# Patient Record
Sex: Female | Born: 2000
Health system: Southern US, Community
[De-identification: ages and names within clinical notes are randomized; demographics above are authoritative.]

---
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
## 2019-02-25 ENCOUNTER — Other Ambulatory Visit: Payer: Self-pay

## 2019-02-26 ENCOUNTER — Ambulatory Visit (INDEPENDENT_AMBULATORY_CARE_PROVIDER_SITE_OTHER): Payer: BC Managed Care – PPO | Admitting: Physician Assistant

## 2019-02-26 ENCOUNTER — Encounter: Payer: Self-pay | Admitting: Physician Assistant

## 2019-02-26 VITALS — BP 100/70 | HR 78 | Temp 97.6°F | Ht 69.0 in | Wt 133.5 lb

## 2019-02-26 DIAGNOSIS — L700 Acne vulgaris: Secondary | ICD-10-CM

## 2019-02-26 DIAGNOSIS — R21 Rash and other nonspecific skin eruption: Secondary | ICD-10-CM | POA: Diagnosis not present

## 2019-02-26 MED ORDER — DOXYCYCLINE HYCLATE 100 MG PO TABS: 100.0000 mg | ORAL_TABLET | Freq: Two times a day (BID) | ORAL | 0 refills | Status: AC

## 2019-02-26 NOTE — Progress Notes (Signed)
Rachael Payne is a 18 y.o. female here for a new problem.  I acted as a Neurosurgeon for [employer], PA-C Corky Mull, LPN  History of Present Illness:   Chief Complaint  Patient presents with  . Rash    HPI   Rash Pt c/o red rash on face, nose, mouth and sides of face. Started 4 days ago. She has tried OTC triple-antibiotic creams with no relief, took Benadryl and rash faded but came back. Burns when she puts moisturizer on.  Denies: fever, chills, significant crusting/oozing, new face products, changes to mask, new foods, concerns for food allergies  History reviewed. No pertinent past medical history.   Social History   Socioeconomic History  . Marital status: Single    Spouse name: Not on file  . Number of children: Not on file  . Years of education: Not on file  . Highest education level: Not on file  Occupational History  . Not on file  Tobacco Use  . Smoking status: Never Smoker  . Smokeless tobacco: Never Used  Substance and Sexual Activity  . Alcohol use: Not on file    Comment: occ   . Drug use: Never  . Sexual activity: Never  Other Topics Concern  . Not on file  Social History Narrative   [place] -- Exploratory Studies   Social Determinants of Health   Financial Resource Strain:   . Difficulty of Paying Living Expenses: Not on file  Food Insecurity:   . Worried About Programme researcher, broadcasting/film/video in the Last Year: Not on file  . Ran Out of Food in the Last Year: Not on file  Transportation Needs:   . Lack of Transportation (Medical): Not on file  . Lack of Transportation (Non-Medical): Not on file  Physical Activity:   . Days of Exercise per Week: Not on file  . Minutes of Exercise per Session: Not on file  Stress:   . Feeling of Stress : Not on file  Social Connections:   . Frequency of Communication with Friends and Family: Not on file  . Frequency of Social Gatherings with Friends and Family: Not on file  . Attends Religious Services: Not on file   . Active Member of Clubs or Organizations: Not on file  . Attends Banker Meetings: Not on file  . Marital Status: Not on file  Intimate Partner Violence:   . Fear of Current or Ex-Partner: Not on file  . Emotionally Abused: Not on file  . Physically Abused: Not on file  . Sexually Abused: Not on file    History reviewed. No pertinent surgical history.  Family History  Problem Relation Age of Onset  . Depression Mother   . Diabetes Maternal Grandfather   . Hyperlipidemia Maternal Grandfather   . Hypertension Maternal Grandfather   . Arthritis Paternal Grandmother   . Hearing loss Paternal Grandfather     No Known Allergies  Current Medications:   Current Outpatient Medications:  .  ibuprofen (ADVIL,MOTRIN) 200 MG tablet, Take by mouth., Disp: , Rfl:  .  ondansetron (ZOFRAN) 4 MG tablet, Take 1 tablet (4 mg total) by mouth every 8 (eight) hours as needed for nausea or vomiting., Disp: 20 tablet, Rfl: 0 .  doxycycline (VIBRA-TABS) 100 MG tablet, Take 1 tablet (100 mg total) by mouth 2 (two) times daily., Disp: 20 tablet, Rfl: 0   Review of Systems:   ROS  Negative unless otherwise specified per HPI.  Vitals:   Vitals:  02/26/19 1102  BP: 100/70  Pulse: 78  Temp: 97.6 F (36.4 C)  TempSrc: Temporal  Weight: 133 lb 8 oz (60.6 kg)  Height: 5\' 9"  (1.753 m)     Body mass index is 19.71 kg/m.  Physical Exam:   Physical Exam Constitutional:      Appearance: She is well-developed.  HENT:     Head: Normocephalic and atraumatic.  Eyes:     Conjunctiva/sclera: Conjunctivae normal.  Pulmonary:     Effort: Pulmonary effort is normal.  Musculoskeletal:        General: Normal range of motion.     Cervical back: Normal range of motion and neck supple.  Skin:    General: Skin is warm and dry.     Comments: Erythematous rash to bilateral lateral nasal areas, pustules to bilateral temples  Neurological:     Mental Status: She is alert and oriented to  person, place, and time.  Psychiatric:        Behavior: Behavior normal.        Thought Content: Thought content normal.        Judgment: Judgment normal.      Assessment and Plan:   Rachael Payne was seen today for rash.  Diagnoses and all orders for this visit:  Rash Suspect dermatitis? Recommend daily antihistamine such as claritin, allegra, zyrtec; mask and face hygiene, and also will trial round of oral doxycycline to cover for any possible secondary bacterial infection.  Acne vulgaris Discussed adequate mask and face hygiene. Also trial round of doxycycline for the above problem. Consider Differin if symptoms do not improve.  Other orders -     doxycycline (VIBRA-TABS) 100 MG tablet; Take 1 tablet (100 mg total) by mouth 2 (two) times daily.  Reviewed expectations re: course of current medical issues. Discussed self-management of symptoms. Outlined signs and symptoms indicating need for more acute intervention. Patient verbalized understanding and all questions were answered. See orders for this visit as documented in the electronic medical record. Patient received an After-Visit Summary.  CMA or LPN served as scribe during this visit. History, Physical, and Plan performed by medical provider. The above documentation has been reviewed and is accurate and complete.  Rachael Coke, PA-C

## 2019-02-26 NOTE — Patient Instructions (Signed)
It was great to see you!  1. Start daily antihistamine such as allegra, zyrtec, or claritin (generic is fine, all available over the counter) and take for at least one week. 2. When done wearing your mask for the day, please wash, pat dry, and moisturize your face well 3. Start the oral antibiotic to help with any possible infection; avoid using any topical antibiotic creams, this can make the rash worse  Keep me posted.  Take care,  Inda Coke PA-C

## 2019-03-03 ENCOUNTER — Ambulatory Visit: Payer: BC Managed Care – PPO | Attending: Internal Medicine

## 2019-03-03 DIAGNOSIS — Z20822 Contact with and (suspected) exposure to covid-19: Secondary | ICD-10-CM

## 2019-03-04 LAB — NOVEL CORONAVIRUS, NAA: SARS-CoV-2, NAA: NOT DETECTED

## 2019-03-10 ENCOUNTER — Ambulatory Visit: Payer: BC Managed Care – PPO | Attending: Internal Medicine

## 2019-03-10 DIAGNOSIS — Z20822 Contact with and (suspected) exposure to covid-19: Secondary | ICD-10-CM

## 2019-03-11 LAB — NOVEL CORONAVIRUS, NAA: SARS-CoV-2, NAA: NOT DETECTED

## 2019-05-09 DIAGNOSIS — L700 Acne vulgaris: Secondary | ICD-10-CM | POA: Diagnosis not present

## 2019-08-11 DIAGNOSIS — D485 Neoplasm of uncertain behavior of skin: Secondary | ICD-10-CM | POA: Diagnosis not present

## 2019-08-11 DIAGNOSIS — B078 Other viral warts: Secondary | ICD-10-CM | POA: Diagnosis not present

## 2019-08-11 DIAGNOSIS — L700 Acne vulgaris: Secondary | ICD-10-CM | POA: Diagnosis not present

## 2019-12-02 DIAGNOSIS — L700 Acne vulgaris: Secondary | ICD-10-CM | POA: Diagnosis not present

## 2020-02-16 DIAGNOSIS — J029 Acute pharyngitis, unspecified: Secondary | ICD-10-CM | POA: Diagnosis not present

## 2020-03-02 DIAGNOSIS — Z20828 Contact with and (suspected) exposure to other viral communicable diseases: Secondary | ICD-10-CM | POA: Diagnosis not present

## 2020-03-02 DIAGNOSIS — Z20822 Contact with and (suspected) exposure to covid-19: Secondary | ICD-10-CM | POA: Diagnosis not present

## 2020-03-15 DIAGNOSIS — L700 Acne vulgaris: Secondary | ICD-10-CM | POA: Diagnosis not present

## 2020-03-21 DIAGNOSIS — K3580 Unspecified acute appendicitis: Secondary | ICD-10-CM | POA: Diagnosis not present

## 2020-03-21 DIAGNOSIS — R109 Unspecified abdominal pain: Secondary | ICD-10-CM | POA: Diagnosis not present

## 2020-03-21 DIAGNOSIS — R110 Nausea: Secondary | ICD-10-CM | POA: Diagnosis not present

## 2020-03-21 DIAGNOSIS — R1031 Right lower quadrant pain: Secondary | ICD-10-CM | POA: Diagnosis not present

## 2020-03-21 DIAGNOSIS — K659 Peritonitis, unspecified: Secondary | ICD-10-CM | POA: Diagnosis not present

## 2020-03-25 DIAGNOSIS — L709 Acne, unspecified: Secondary | ICD-10-CM | POA: Diagnosis not present

## 2020-03-25 DIAGNOSIS — K6389 Other specified diseases of intestine: Secondary | ICD-10-CM | POA: Diagnosis not present

## 2020-05-17 DIAGNOSIS — L818 Other specified disorders of pigmentation: Secondary | ICD-10-CM | POA: Diagnosis not present

## 2020-05-17 DIAGNOSIS — L700 Acne vulgaris: Secondary | ICD-10-CM | POA: Diagnosis not present

## 2020-09-25 DIAGNOSIS — B001 Herpesviral vesicular dermatitis: Secondary | ICD-10-CM | POA: Diagnosis not present
